# Patient Record
Sex: Male | Born: 2000 | Race: White | Hispanic: No | Marital: Single | Smoking: Current every day smoker
Health system: Southern US, Community
[De-identification: ages and names within clinical notes are randomized; demographics above are authoritative.]

## PROBLEM LIST (undated history)

## (undated) DIAGNOSIS — J45909 Unspecified asthma, uncomplicated: Secondary | ICD-10-CM

## (undated) HISTORY — PX: TONSILLECTOMY: SUR1361

## (undated) HISTORY — PX: TYMPANOPLASTY: SHX33

---
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
## 2022-02-27 ENCOUNTER — Emergency Department
Admission: EM | Admit: 2022-02-27 | Discharge: 2022-02-27 | Disposition: A | Discharge status: Home / Self Care | Payer: Medicaid Other | Attending: Emergency Medicine | Admitting: Emergency Medicine

## 2022-02-27 ENCOUNTER — Emergency Department: Payer: Medicaid Other

## 2022-02-27 ENCOUNTER — Other Ambulatory Visit: Payer: Self-pay

## 2022-02-27 DIAGNOSIS — K5900 Constipation, unspecified: Secondary | ICD-10-CM | POA: Insufficient documentation

## 2022-02-27 DIAGNOSIS — R1031 Right lower quadrant pain: Secondary | ICD-10-CM

## 2022-02-27 LAB — COMPREHENSIVE METABOLIC PANEL
ALT: 6 U/L (ref 0–44)
AST: 22 U/L (ref 15–41)
Albumin: 5.1 g/dL — ABNORMAL HIGH (ref 3.5–5.0)
Alkaline Phosphatase: 57 U/L (ref 38–126)
Anion gap: 8 (ref 5–15)
BUN: 15 mg/dL (ref 6–20)
CO2: 26 mmol/L (ref 22–32)
Calcium: 9.7 mg/dL (ref 8.9–10.3)
Chloride: 105 mmol/L (ref 98–111)
Creatinine, Ser: 0.95 mg/dL (ref 0.61–1.24)
GFR, Estimated: >60 mL/min (ref >60)
Glucose, Bld: 90 mg/dL (ref 70–99)
Potassium: 4.1 mmol/L (ref 3.5–5.1)
Sodium: 139 mmol/L (ref 135–145)
Total Bilirubin: 1.1 mg/dL (ref 0.3–1.2)
Total Protein: 8.5 g/dL — ABNORMAL HIGH (ref 6.5–8.1)

## 2022-02-27 LAB — CBC WITH DIFFERENTIAL/PLATELET
Abs Immature Granulocytes: 0.02 10*3/uL (ref 0.00–0.07)
Basophils Absolute: 0 10*3/uL (ref 0.0–0.1)
Basophils Relative: 1 %
Eosinophils Absolute: 0.1 10*3/uL (ref 0.0–0.5)
Eosinophils Relative: 2 %
HCT: 44.2 % (ref 39.0–52.0)
Hemoglobin: 15.4 g/dL (ref 13.0–17.0)
Immature Granulocytes: 0 %
Lymphocytes Relative: 34 %
Lymphs Abs: 2 10*3/uL (ref 0.7–4.0)
MCH: 30.4 pg (ref 26.0–34.0)
MCHC: 34.8 g/dL (ref 30.0–36.0)
MCV: 87.4 fL (ref 80.0–100.0)
Monocytes Absolute: 0.5 10*3/uL (ref 0.1–1.0)
Monocytes Relative: 8 %
Neutro Abs: 3.3 10*3/uL (ref 1.7–7.7)
Neutrophils Relative %: 55 %
Platelets: 218 10*3/uL (ref 150–400)
RBC: 5.06 MIL/uL (ref 4.22–5.81)
RDW: 12 % (ref 11.5–15.5)
WBC: 5.9 10*3/uL (ref 4.0–10.5)
nRBC: 0 % (ref 0.0–0.2)

## 2022-02-27 LAB — LIPASE, BLOOD: Lipase: 38 U/L (ref 11–51)

## 2022-02-27 MED ORDER — IOHEXOL 350 MG/ML SOLN
75.0000 mL | Freq: Once | INTRAVENOUS | Status: AC | PRN
  Administered 2022-02-27: 75 mL via INTRAVENOUS

## 2022-02-27 NOTE — Discharge Instructions (Addendum)

## 2022-02-27 NOTE — ED Triage Notes (Signed)
Pt in the ED for abdominal pain. Pt has been having these issues since he was 21 years old. Pt states he feels nauseated. Pt denies fever. Mother gave pt one of her muscle relaxer's and it did not relieve symptoms. Pt is CAOx4 and in no acute distress. Pt is still able to eat with no issues. He was at [facility] when it started bothering him again so he came here. Pt has seen in the ER since he was 14.

## 2022-02-27 NOTE — ED Provider Notes (Signed)
[facility] Provider Note    Event Date/Time   First MD Initiated Contact with Patient 02/27/22 2148     (approximate)   History   Chief Complaint Abdominal Pain   HPI  Johnathan Austin is a 21 y.o. male with no significant past medical history who presents to the ED complaining of abdominal pain.  Patient reports that he has been dealing with intermittent episodes of pain in the right lower quadrant of his abdomen ever since he was 21 years old.  He has been seen by his PCP for this problem before and referred to GI, but states he was not able to see them due to insurance issues.  He had another episode of pain this evening, which she describes as sudden onset sharp and stabbing pain in the right lower quadrant of his abdomen.  He denies any pain in his testicles and has not had any difficulty urinating.  He has not had any fever, flank pain, or dysuria.  He felt nauseous with the onset of pain but states this has since improved and he has not had any vomiting or diarrhea.  He denies prior abdominal surgeries.     Physical Exam   Triage Vital Signs: ED Triage Vitals  Enc Vitals Group     BP 02/27/22 1732 (!) 135/99     Pulse Rate 02/27/22 1732 89     Resp 02/27/22 1732 18     Temp 02/27/22 1732 98.2 F (36.8 C)     Temp Source 02/27/22 1732 Oral     SpO2 02/27/22 1732 98 %     Weight 02/27/22 1750 154 lb (69.9 kg)     Height 02/27/22 1750 6\' 2"  (1.88 m)     Head Circumference --      Peak Flow --      Pain Score 02/27/22 1749 9     Pain Loc --      Pain Edu? --      Excl. in [place]? --     Most recent vital signs: Vitals:   02/27/22 1732 02/27/22 2213  BP: (!) 135/99 124/74  Pulse: 89 (!) 56  Resp: 18 16  Temp: 98.2 F (36.8 C) 97.7 F (36.5 C)  SpO2: 98% 98%    Constitutional: Alert and oriented. Eyes: Conjunctivae are normal. Head: Atraumatic. Nose: No congestion/rhinnorhea. Mouth/Throat: Mucous membranes are moist.  Cardiovascular:  Normal rate, regular rhythm. Grossly normal heart sounds.  2+ radial pulses bilaterally. Respiratory: Normal respiratory effort.  No retractions. Lungs CTAB. Gastrointestinal: Soft and tender to palpation in the right lower quadrant with no rebound or guarding.  No CVA tenderness bilaterally. No distention. Musculoskeletal: No lower extremity tenderness nor edema.  Neurologic:  Normal speech and language. No gross focal neurologic deficits are appreciated.    ED Results / Procedures / Treatments   Labs (all labs ordered are listed, but only abnormal results are displayed) Labs Reviewed  COMPREHENSIVE METABOLIC PANEL - Abnormal; Notable for the following components:      Result Value   Total Protein 8.5 (*)    Albumin 5.1 (*)    All other components within normal limits  CBC WITH DIFFERENTIAL/PLATELET  LIPASE, BLOOD  URINALYSIS, ROUTINE W REFLEX MICROSCOPIC   RADIOLOGY CT abdomen/pelvis reviewed and interpreted by me with no inflammatory changes, focal fluid collections, or dilated bowel loops.  PROCEDURES:  Critical Care performed: No  Procedures   MEDICATIONS ORDERED IN ED: Medications  iohexol (OMNIPAQUE) 350 MG/ML injection  75 mL (75 mLs Intravenous Contrast Given 02/27/22 1829)     IMPRESSION / MDM / ASSESSMENT AND PLAN / ED COURSE  I reviewed the triage vital signs and the nursing notes.                              21 y.o. male with no significant past medical history presents to the ED with sudden onset pain in the right lower quadrant of his abdomen earlier this evening, similar to previous episodes that he has experienced for multiple years.  Patient's presentation is most consistent with acute presentation with potential threat to life or bodily function.  Differential diagnosis includes, but is not limited to, appendicitis, kidney stone, UTI, inguinal hernia.  Patient nontoxic-appearing and in no acute distress, vital signs are unremarkable.  He does have  tenderness to palpation in the right lower quadrant of his abdomen, CT scan performed from triage and is negative for acute process.  No evidence of appendicitis, kidney stone, or inguinal hernia.  Symptoms do not seem consistent with testicular torsion or other testicular pathology.  Labs are reassuring with no significant anemia, leukocytosis, electrolyte abnormality, or AKI.  LFTs and lipase are unremarkable.  Patient does report significant issues with constipation, which may be contributing to his symptoms.  He was counseled on regular bowel regimen and will be given new referral to GI.  He was counseled to return to the ED for new or worsening symptoms, patient agrees with plan.      FINAL CLINICAL IMPRESSION(S) / ED DIAGNOSES   Final diagnoses:  Right lower quadrant abdominal pain  Constipation, unspecified constipation type     Rx / DC Orders   ED Discharge Orders     None        Note:  This document was prepared using Dragon voice recognition software and may include unintentional dictation errors.   Chesley Noon, MD 02/27/22 2308

## 2022-03-23 ENCOUNTER — Ambulatory Visit (INDEPENDENT_AMBULATORY_CARE_PROVIDER_SITE_OTHER): Payer: Medicaid Other | Admitting: Gastroenterology

## 2022-03-23 ENCOUNTER — Encounter: Payer: Self-pay | Admitting: Gastroenterology

## 2022-03-23 VITALS — BP 132/82 | HR 111 | Temp 98.7°F | Ht 74.0 in | Wt 136.0 lb

## 2022-03-23 DIAGNOSIS — M7918 Myalgia, other site: Secondary | ICD-10-CM | POA: Diagnosis not present

## 2022-03-23 NOTE — Progress Notes (Signed)
Gastroenterology Consultation  Referring Provider:     Pa, Amada Jupiter* Primary Care Physician:  [place] Primary Gastroenterologist:  Dr. Servando Snare     Reason for Consultation:     Right lower quadrant pain        HPI:   Johnathan Austin is a 21 y.o. y/o male referred for consultation & management of Right lower quadrant pain by Dr. Gean Birchwood, [facility].  This patient comes in today after a report of right lower quadrant pain that he states is been going on for years.  The patient states that it is not made better or worse with anything he eats or drinks.  He also reported not to be associated with any bowel movements although he states that he can have an increase in the pain if he strained to have hard stools.  There is no report of any unexplained weight loss fevers chills nausea vomiting black stools or bloody stools.  The patient does state that he takes his mother's muscle relaxer that makes his symptoms better.  There is no report of any unexplained weight loss.  He does report that he works in a very physical demanding job.  Past Medical History:  Diagnosis Date   Asthma     Past Surgical History:  Procedure Laterality Date   TONSILLECTOMY     TYMPANOPLASTY      Prior to Admission medications   Medication Sig Start Date End Date Taking? Authorizing Provider  acetaminophen (TYLENOL) 325 MG tablet Take 650 mg by mouth every 6 (six) hours as needed.   Yes [provider]    No family history on file.   Social History   Tobacco Use   Smoking status: Every Day    Types: Cigarettes    Passive exposure: Yes   Smokeless tobacco: Never  Substance Use Topics   Alcohol use: No   Drug use: Not Currently    Allergies as of 03/23/2022 - Review Complete 02/27/2022  Allergen Reaction Noted   Other  01/26/2016    Review of Systems:    All systems reviewed and negative except where noted in HPI.   Physical Exam:  BP 132/82 (BP Location:  Left Arm, Patient Position: Sitting, Cuff Size: Normal)   Pulse (!) 111   Temp 98.7 F (37.1 C) (Oral)   Ht 6\' 2"  (1.88 m)   Wt 136 lb (61.7 kg)   BMI 17.46 kg/m  No LMP for male patient. General:   Alert,  Well-developed, well-nourished, pleasant and cooperative in NAD Head:  Normocephalic and atraumatic. Eyes:  Sclera clear, no icterus.   Conjunctiva pink. Ears:  Normal auditory acuity. Neck:  Supple; no masses or thyromegaly. Lungs:  Respirations even and unlabored.  Clear throughout to auscultation.   No wheezes, crackles, or rhonchi. No acute distress. Heart:  Regular rate and rhythm; no murmurs, clicks, rubs, or gallops. Abdomen:  Normal bowel sounds.  No bruits.  Soft, Positive tenderness to one finger palpation on flexing the abdominal wall muscles and non-distended without masses, hepatosplenomegaly or hernias noted.  No guarding or rebound tenderness.  Positive Carnett sign.   Rectal:  Deferred.  Pulses:  Normal pulses noted. Extremities:  No clubbing or edema.  No cyanosis. Neurologic:  Alert and oriented x3;  grossly normal neurologically. Skin:  Intact without significant lesions or rashes.  No jaundice. Lymph Nodes:  No significant cervical adenopathy. Psych:  Alert and cooperative. Normal mood and affect.  Imaging Studies: CT Abdomen  Pelvis W Contrast  Result Date: 02/27/2022 CLINICAL DATA:  RLQ abdominal pain. Pt in the ED for abdominal pain. Pt has been having these issues since he was 21 years old. Pt states he feels nauseated. Pt denies fever. Mother gave pt one of her muscle relaxer's and it did not relieve symptoms. EXAM: CT ABDOMEN AND PELVIS WITH CONTRAST TECHNIQUE: Multidetector CT imaging of the abdomen and pelvis was performed using the standard protocol following bolus administration of intravenous contrast. RADIATION DOSE REDUCTION: This exam was performed according to the departmental dose-optimization program which includes automated exposure control,  adjustment of the mA and/or kV according to patient size and/or use of iterative reconstruction technique. CONTRAST:  59mL OMNIPAQUE IOHEXOL 350 MG/ML SOLN COMPARISON:  None Available. FINDINGS: Lower chest: No acute abnormality. Hepatobiliary: No focal liver abnormality. No gallstones, gallbladder wall thickening, or pericholecystic fluid. No biliary dilatation. Pancreas: No focal lesion. Normal pancreatic contour. No surrounding inflammatory changes. No main pancreatic ductal dilatation. Spleen: Normal in size without focal abnormality. Adrenals/Urinary Tract: No adrenal nodule bilaterally. Bilateral kidneys enhance symmetrically. No hydronephrosis. No hydroureter. The urinary bladder is unremarkable. Stomach/Bowel: Stomach is within normal limits. No evidence of bowel wall thickening or dilatation. Under distended rectum. Appendix appears normal. Vascular/Lymphatic: No abdominal aorta or iliac aneurysm.No abdominal, pelvic, or inguinal lymphadenopathy. Reproductive: Prostate is unremarkable. Other: Trace nonspecific simple free fluid within the pelvis. No intraperitoneal free gas. No organized fluid collection. Musculoskeletal: No abdominal wall hernia or abnormality. No suspicious lytic or blastic osseous lesions. No acute displaced fracture. Multilevel degenerative changes of the spine. IMPRESSION: No acute intra-abdominal or intrapelvic abnormality. Electronically Signed   By: Tish Frederickson M.D.   On: 02/27/2022 18:47    Assessment and Plan:   Johnathan Austin is a 21 y.o. y/o male Who comes in today with a history of right-sided abdominal pain not associated with any GI symptoms and reports that it is made better when he takes muscle relaxants.  The patient's symptoms are consistent with musculoskeletal pain.  The patient has pain and reproducible by lifting his legs 6 inches above the exam table while palpating the right lower abdomen with 1 finger.  The patient has been explained this and has been  told to use warm compresses and avoid any further straining of the abdominal wall muscles.  The patient has been explained the plan and agrees with it.    Midge Minium, MD. Clementeen Graham    Note: This dictation was prepared with Dragon dictation along with smaller phrase technology. Any transcriptional errors that result from this process are unintentional.

## 2022-04-22 DIAGNOSIS — R519 Headache, unspecified: Secondary | ICD-10-CM | POA: Diagnosis not present

## 2022-04-27 DIAGNOSIS — Z0000 Encounter for general adult medical examination without abnormal findings: Secondary | ICD-10-CM | POA: Diagnosis not present

## 2022-04-27 DIAGNOSIS — Z68.41 Body mass index (BMI) pediatric, less than 5th percentile for age: Secondary | ICD-10-CM | POA: Diagnosis not present

## 2022-04-27 DIAGNOSIS — Z7189 Other specified counseling: Secondary | ICD-10-CM | POA: Diagnosis not present

## 2022-04-27 DIAGNOSIS — Z713 Dietary counseling and surveillance: Secondary | ICD-10-CM | POA: Diagnosis not present

## 2022-06-16 ENCOUNTER — Other Ambulatory Visit: Payer: Self-pay

## 2022-06-16 ENCOUNTER — Emergency Department: Payer: Medicaid Other

## 2022-06-16 ENCOUNTER — Emergency Department
Admission: EM | Admit: 2022-06-16 | Discharge: 2022-06-16 | Disposition: A | Discharge status: Home / Self Care | Payer: Medicaid Other | Attending: Emergency Medicine | Admitting: Emergency Medicine

## 2022-06-16 DIAGNOSIS — R112 Nausea with vomiting, unspecified: Secondary | ICD-10-CM | POA: Insufficient documentation

## 2022-06-16 DIAGNOSIS — R9431 Abnormal electrocardiogram [ECG] [EKG]: Secondary | ICD-10-CM | POA: Diagnosis not present

## 2022-06-16 DIAGNOSIS — W19XXXA Unspecified fall, initial encounter: Secondary | ICD-10-CM | POA: Insufficient documentation

## 2022-06-16 DIAGNOSIS — R1031 Right lower quadrant pain: Secondary | ICD-10-CM | POA: Diagnosis not present

## 2022-06-16 DIAGNOSIS — R519 Headache, unspecified: Secondary | ICD-10-CM | POA: Diagnosis not present

## 2022-06-16 DIAGNOSIS — R55 Syncope and collapse: Secondary | ICD-10-CM | POA: Insufficient documentation

## 2022-06-16 LAB — CBC
HCT: 43.4 % (ref 39.0–52.0)
Hemoglobin: 15 g/dL (ref 13.0–17.0)
MCH: 30.6 pg (ref 26.0–34.0)
MCHC: 34.6 g/dL (ref 30.0–36.0)
MCV: 88.6 fL (ref 80.0–100.0)
Platelets: 248 10*3/uL (ref 150–400)
RBC: 4.9 MIL/uL (ref 4.22–5.81)
RDW: 12.3 % (ref 11.5–15.5)
WBC: 7 10*3/uL (ref 4.0–10.5)
nRBC: 0 % (ref 0.0–0.2)

## 2022-06-16 LAB — COMPREHENSIVE METABOLIC PANEL
ALT: 10 U/L (ref 0–44)
AST: 22 U/L (ref 15–41)
Albumin: 4.9 g/dL (ref 3.5–5.0)
Alkaline Phosphatase: 63 U/L (ref 38–126)
Anion gap: 8 (ref 5–15)
BUN: 19 mg/dL (ref 6–20)
CO2: 27 mmol/L (ref 22–32)
Calcium: 9.5 mg/dL (ref 8.9–10.3)
Chloride: 104 mmol/L (ref 98–111)
Creatinine, Ser: 1.02 mg/dL (ref 0.61–1.24)
GFR, Estimated: >60 mL/min (ref >60)
Glucose, Bld: 103 mg/dL — ABNORMAL HIGH (ref 70–99)
Potassium: 4 mmol/L (ref 3.5–5.1)
Sodium: 139 mmol/L (ref 135–145)
Total Bilirubin: 0.7 mg/dL (ref 0.3–1.2)
Total Protein: 8 g/dL (ref 6.5–8.1)

## 2022-06-16 LAB — LIPASE, BLOOD: Lipase: 37 U/L (ref 11–51)

## 2022-06-16 MED ORDER — ONDANSETRON 8 MG PO TBDP: 8.0000 mg | ORAL_TABLET | Freq: Three times a day (TID) | ORAL | 0 refills | Status: AC | PRN

## 2022-06-16 MED ORDER — ONDANSETRON 4 MG PO TBDP
8.0000 mg | ORAL_TABLET | Freq: Once | ORAL | Status: AC
  Administered 2022-06-16: 8 mg via ORAL
  Filled 2022-06-16: qty 2

## 2022-06-16 NOTE — ED Triage Notes (Addendum)
Pt to ED via POV c/o of RLQ abd pain and fall/loc. Pt states RLQ pain started around 630 this afternoon. Pt still has appendix. Pt also reports tripping over porch stairs, hit head, and had LOC. Pt endorsing headache and lightheadedness. Denies CP, SOB, n/v, fevers

## 2022-06-16 NOTE — ED Provider Notes (Signed)
[facility] Provider Note   Event Date/Time   First MD Initiated Contact with Patient 06/16/22 2159     (approximate) History  Abdominal Pain and Loss of Consciousness  HPI Johnathan Austin is a 22 y.o. male with a past medical history presents for right lower quadrant abdominal pain with associated nausea/vomiting that began this evening.  Patient also states that he is concerned as he had a fall a few days prior with loss of consciousness and head injury.  Patient describes the right lower quadrant abdominal pain is 9/10, nonradiating, and stable since onset.  Patient denies any exacerbating or relieving factors. ROS: Patient currently denies any vision changes, tinnitus, difficulty speaking, facial droop, sore throat, chest pain, shortness of breath, diarrhea, dysuria, or weakness/numbness/paresthesias in any extremity   Physical Exam  Triage Vital Signs: ED Triage Vitals  Enc Vitals Group     BP 06/16/22 2030 (!) 139/92     Pulse Rate 06/16/22 2030 95     Resp 06/16/22 2030 18     Temp 06/16/22 2030 98.3 F (36.8 C)     Temp Source 06/16/22 2030 Oral     SpO2 06/16/22 2030 100 %     Weight 06/16/22 2031 155 lb (70.3 kg)     Height 06/16/22 2031 '6\' 1"'$  (1.854 m)     Head Circumference --      Peak Flow --      Pain Score 06/16/22 2031 9     Pain Loc --      Pain Edu? --      Excl. in [place]? --    Most recent vital signs: Vitals:   06/16/22 2030 06/16/22 2306  BP: (!) 139/92 127/84  Pulse: 95 89  Resp: 18 18  Temp: 98.3 F (36.8 C)   SpO2: 100% 99%   General: Awake, oriented x4. CV:  Good peripheral perfusion.  Resp:  Normal effort.  Abd:  No distention.  Other:  Teenage Caucasian male laying in bed in no acute distress ED Results / Procedures / Treatments  Labs (all labs ordered are listed, but only abnormal results are displayed) Labs Reviewed  COMPREHENSIVE METABOLIC PANEL - Abnormal; Notable for the following components:      Result  Value   Glucose, Bld 103 (*)    All other components within normal limits  LIPASE, BLOOD  CBC  RADIOLOGY ED MD interpretation: CT of the head without contrast interpreted by me shows no evidence of acute abnormalities including no intracerebral hemorrhage, obvious masses, or significant edema -Agree with radiology assessment Official radiology report(s): CT HEAD WO CONTRAST  Result Date: 06/16/2022 CLINICAL DATA:  Head trauma.  RIGHT lower quadrant pain EXAM: CT HEAD WITHOUT CONTRAST TECHNIQUE: Contiguous axial images were obtained from the base of the skull through the vertex without intravenous contrast. RADIATION DOSE REDUCTION: This exam was performed according to the departmental dose-optimization program which includes automated exposure control, adjustment of the mA and/or kV according to patient size and/or use of iterative reconstruction technique. COMPARISON:  None Available. FINDINGS: Brain: No acute intracranial hemorrhage. No focal mass lesion. No CT evidence of acute infarction. No midline shift or mass effect. No hydrocephalus. Basilar cisterns are patent. Vascular: No hyperdense vessel or unexpected calcification. Skull: Normal. Negative for fracture or focal lesion. Sinuses/Orbits: Paranasal sinuses and mastoid air cells are clear. Orbits are clear. Other: None. IMPRESSION: No intracranial trauma. Electronically Signed   By: Suzy Bouchard M.D.   On: 06/16/2022 20:50  PROCEDURES: Critical Care performed: No .1-3 Lead EKG Interpretation  Performed by: Naaman Plummer, MD Authorized by: Naaman Plummer, MD     Interpretation: normal     ECG rate:  71   ECG rate assessment: normal     Rhythm: sinus rhythm     Ectopy: none     Conduction: normal    MEDICATIONS ORDERED IN ED: Medications  ondansetron (ZOFRAN-ODT) disintegrating tablet 8 mg (8 mg Oral Given 06/16/22 2212)   IMPRESSION / MDM / ASSESSMENT AND PLAN / ED COURSE  I reviewed the triage vital signs and the nursing  notes.                             The patient is on the cardiac monitor to evaluate for evidence of arrhythmia and/or significant heart rate changes. Patient's presentation is most consistent with acute presentation with potential threat to life or bodily function. Patient presents for abdominal pain.  Differential diagnosis includes appendicitis, abdominal aortic aneurysm, surgical biliary disease, pancreatitis, SBO, mesenteric ischemia, serious intra-abdominal bacterial illness, genital torsion. Doubt atypical ACS. Based on history, physical exam, radiologic/laboratory evaluation, there is no red flag results or symptomatology requiring emergent intervention or need for admission at this time Pt tolerating PO. Disposition: Patient will be discharged with strict return precautions and follow up with primary MD within 12-24 hours for further evaluation. Patient understands that this still may have an early presentation of an emergent medical condition such as appendicitis that will require a recheck.   FINAL CLINICAL IMPRESSION(S) / ED DIAGNOSES   Final diagnoses:  Right lower quadrant abdominal pain  Fall, initial encounter   Rx / DC Orders   ED Discharge Orders          Ordered    ondansetron (ZOFRAN-ODT) 8 MG disintegrating tablet  Every 8 hours PRN        06/16/22 2244           Note:  This document was prepared using Dragon voice recognition software and may include unintentional dictation errors.   Naaman Plummer, MD 06/16/22 [phone]

## 2022-06-16 NOTE — ED Notes (Signed)
Patient discharged at this time. Ambulated to lobby with independent and steady gait. Breathing unlabored speaking in full sentences. Verbalized understanding of all discharge, follow up, and medication teaching. Discharged homed with all belongings.   

## 2022-09-13 IMAGING — CR DG FOOT COMPLETE 3+V*L*
3 series · 3 of 3 positions shown · non-contrast
Comparison: None.

CLINICAL DATA: Blunt trauma.

EXAM:
LEFT FOOT - COMPLETE 3+ VIEW

[foot ap]
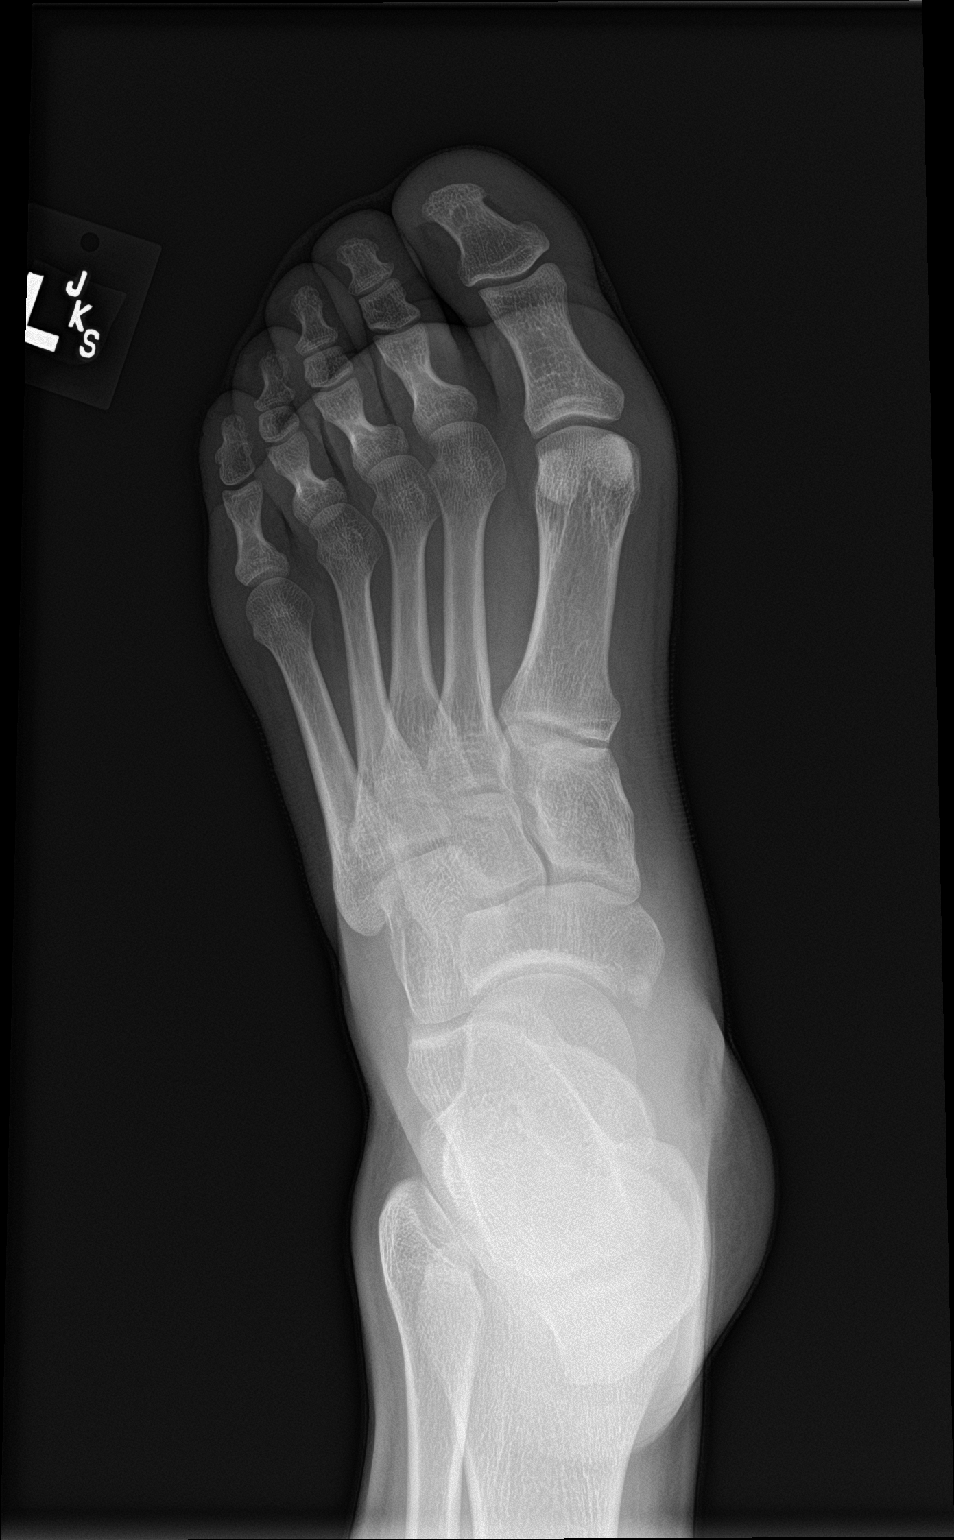

[foot obl]
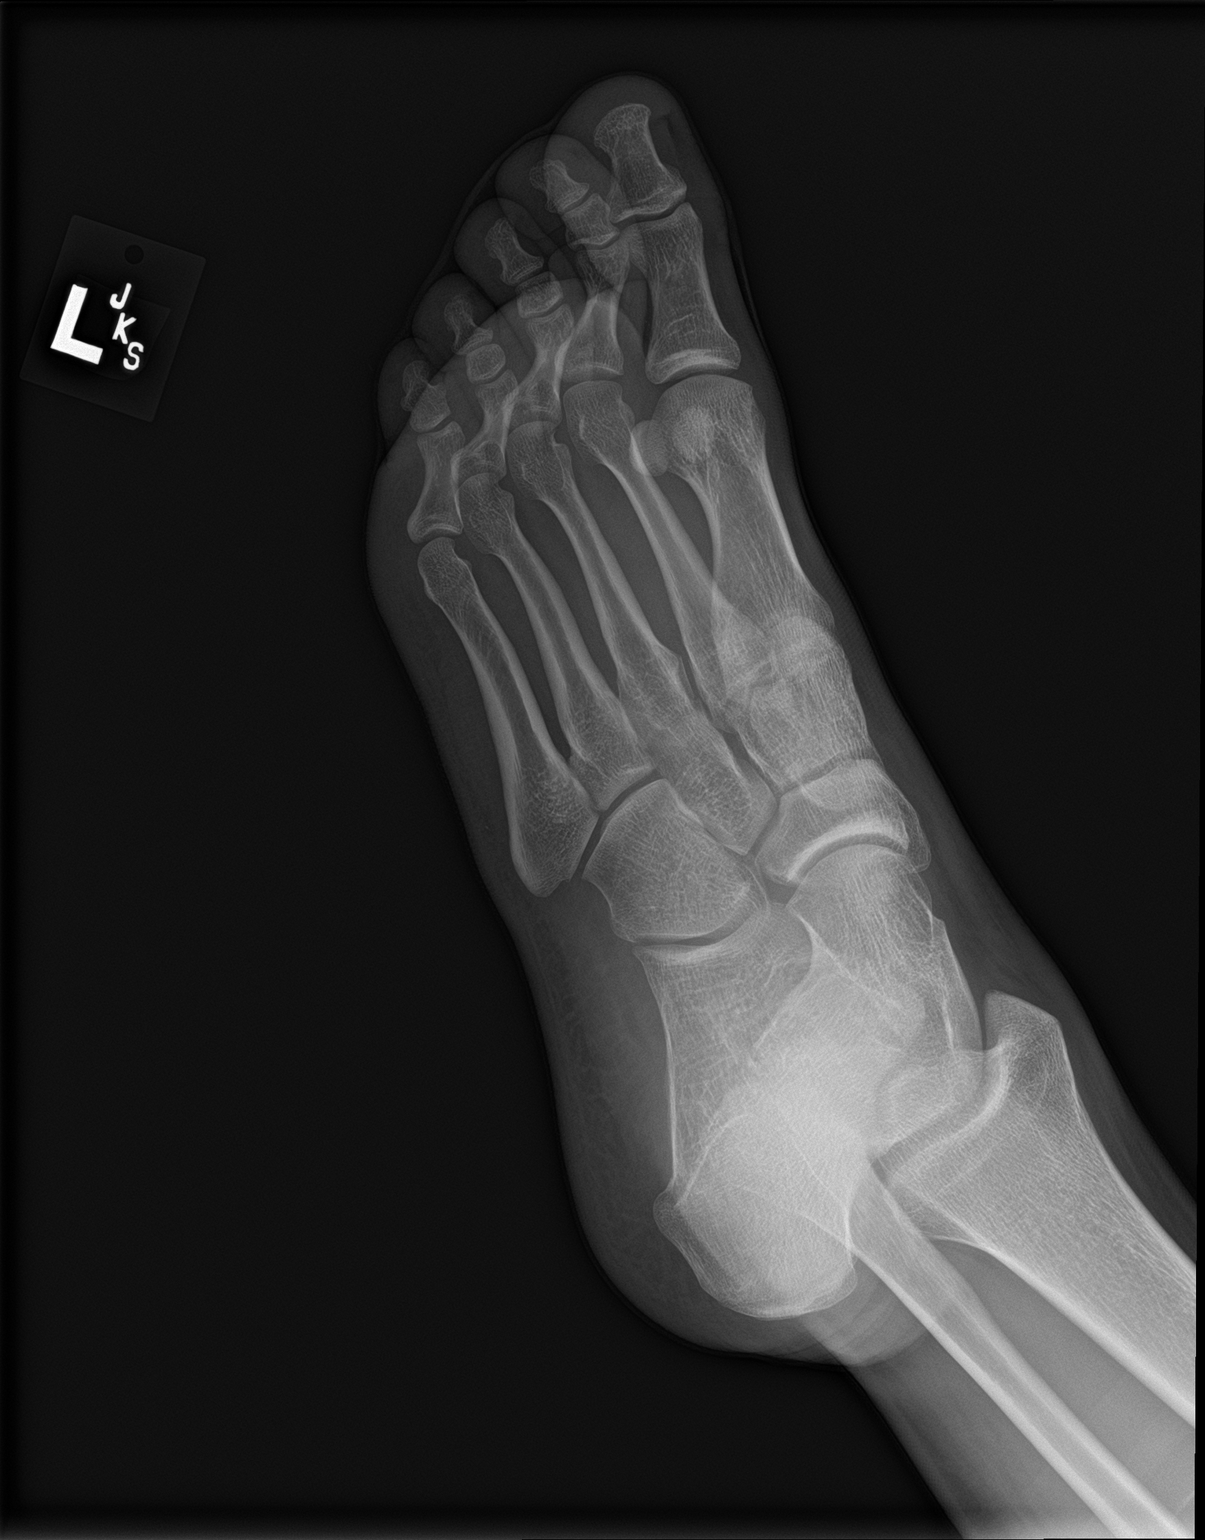

[foot lat]
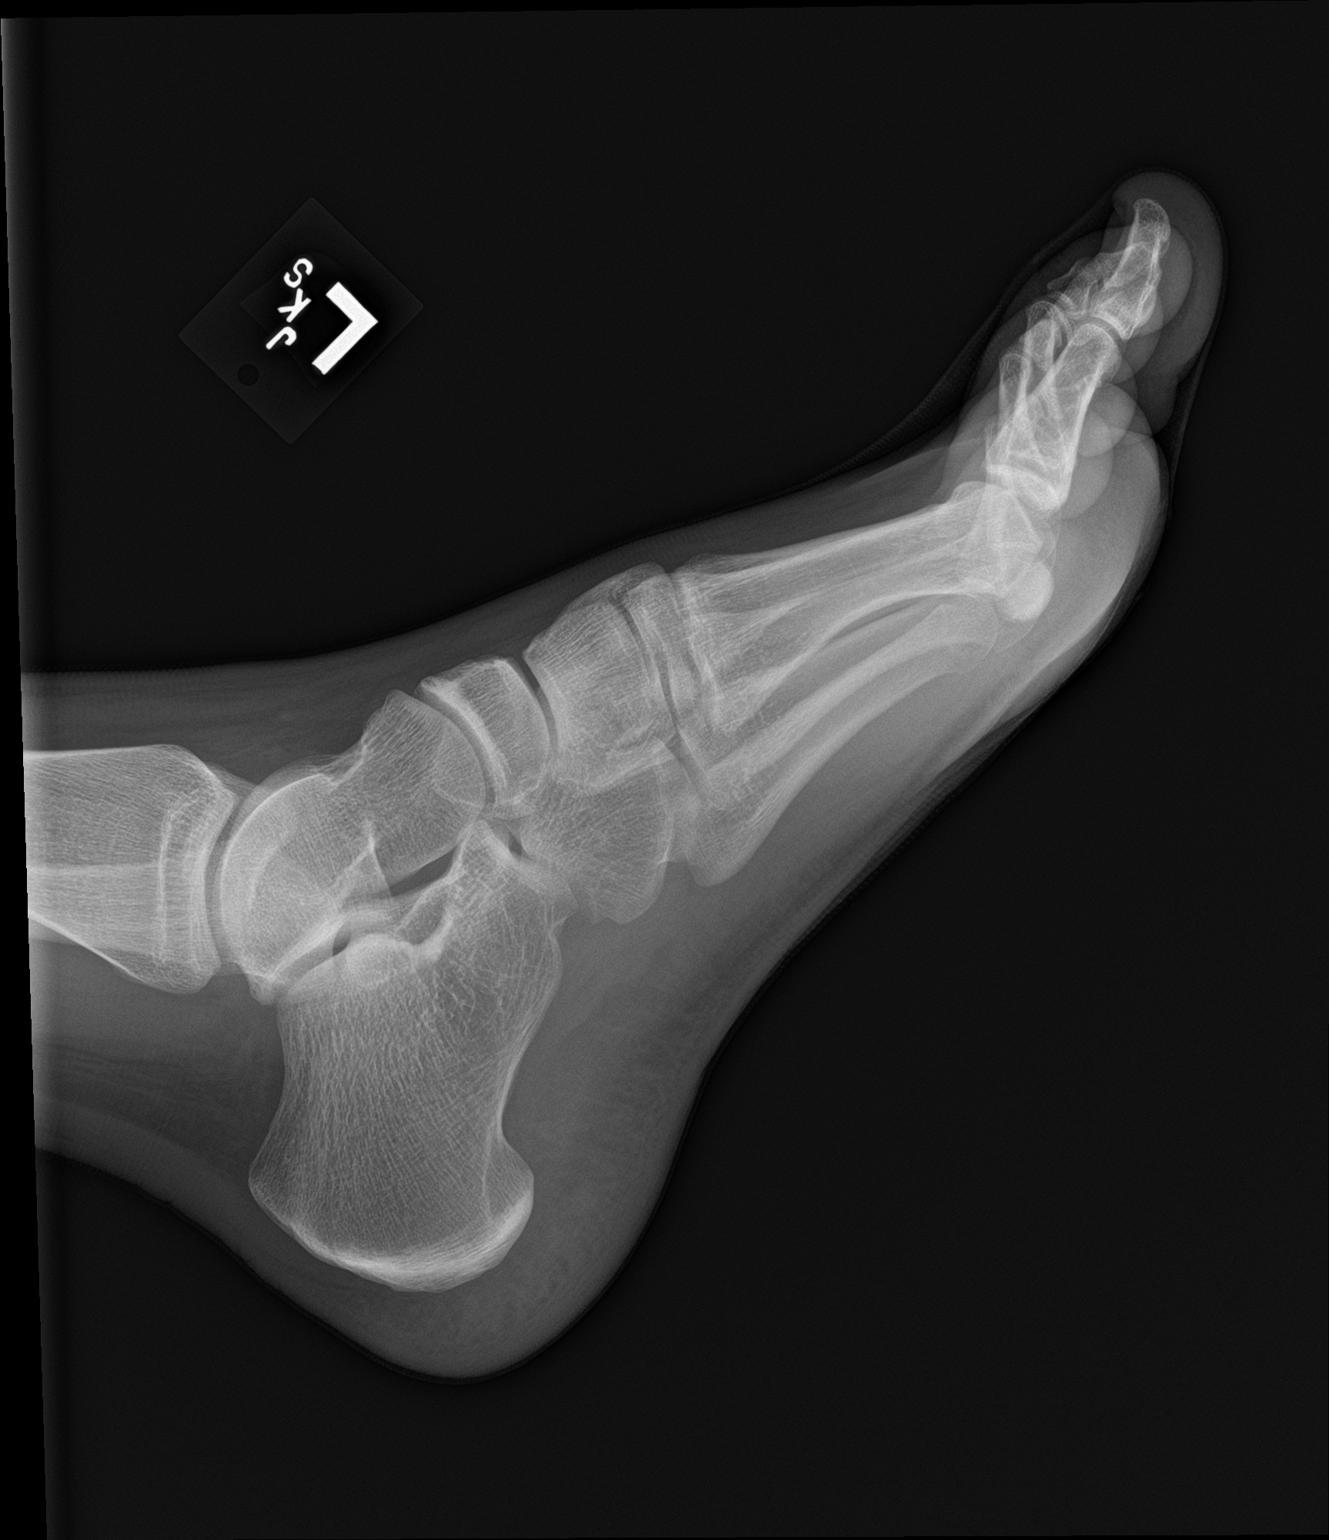

[3 of 3 positions shown; findings below may reference images not displayed]

FINDINGS: No fracture or dislocation of mid foot or forefoot. The phalanges
are normal. The calcaneus is normal. No soft tissue abnormality.
IMPRESSION: No fracture or dislocation.

## 2023-10-08 ENCOUNTER — Emergency Department
Admission: EM | Admit: 2023-10-08 | Discharge: 2023-10-08 | Disposition: A | Discharge status: Home / Self Care | Attending: Emergency Medicine | Admitting: Emergency Medicine

## 2023-10-08 ENCOUNTER — Emergency Department

## 2023-10-08 ENCOUNTER — Other Ambulatory Visit: Payer: Self-pay

## 2023-10-08 DIAGNOSIS — M25522 Pain in left elbow: Secondary | ICD-10-CM | POA: Insufficient documentation

## 2023-10-08 DIAGNOSIS — Y9366 Activity, soccer: Secondary | ICD-10-CM | POA: Diagnosis not present

## 2023-10-08 DIAGNOSIS — W010XXA Fall on same level from slipping, tripping and stumbling without subsequent striking against object, initial encounter: Secondary | ICD-10-CM | POA: Diagnosis not present

## 2023-10-08 DIAGNOSIS — J45909 Unspecified asthma, uncomplicated: Secondary | ICD-10-CM | POA: Insufficient documentation

## 2023-10-08 NOTE — ED Provider Notes (Signed)
   [facility] Provider Note    Event Date/Time   First MD Initiated Contact with Patient 10/08/23 1315     (approximate)   History   Arm Injury   HPI  Johnathan Austin is a 23 y.o. male with history of asthma and as listed in EMR presents to the emergency department for treatment and evaluation of left elbow pain after tripping over a curb last night while playing soccer with his nieces and landed on the pavement.  Pain increases with attempt to fully extend.  Some relief with ibuprofen .      Physical Exam   Triage Vital Signs:   Most recent vital signs: Vitals:   10/08/23 1301  BP: 119/80  Pulse: 77  Resp: 15  Temp: 98.9 F (37.2 C)  SpO2: 100%    General: Awake, no distress.  CV:  Good peripheral perfusion.  Resp:  Normal effort.  Abd:  No distention.  Other:  No deformity noted of the left elbow.  Tender over the olecranon and lateral aspect of the left elbow.  Pain with attempt to fully extend at the elbow.  Full range of motion of the left shoulder, hand, and wrist without pain.   ED Results / Procedures / Treatments   Labs (all labs ordered are listed, but only abnormal results are displayed) Labs Reviewed - No data to display   EKG  Not indicated   RADIOLOGY  Image and radiology report reviewed and interpreted by me. Radiology report consistent with the same.  Imaging of the left elbow negative for acute concerns.  PROCEDURES:  Critical Care performed: No  Procedures   MEDICATIONS ORDERED IN ED:  Medications - No data to display   IMPRESSION / MDM / ASSESSMENT AND PLAN / ED COURSE   I have reviewed the triage note.  Differential diagnosis includes, but is not limited to,  radial head fracture, proximal ulna fracture, olecranon fracture.,  Elbow strain  Patient's presentation is most consistent with acute illness / injury with system symptoms.  23 year old male presenting to the emergency department after  falling last night while playing soccer.  See HPI for further details.  Vital signs are stable.  On exam, he is unable to fully extend at the left elbow without significant pain.  X-ray does not show an occult fracture..  Symptoms are somewhat concerning for nondisplaced radial head fracture.  Plan will be to place him in a sling for no more than 3 days.  He is to remove the sling at least 3 times a day to perform gentle range of motion.  He will continue to take the ibuprofen .  If not improving over the week, he is to follow-up with orthopedics.      FINAL CLINICAL IMPRESSION(S) / ED DIAGNOSES   Final diagnoses:  Elbow pain, left     Rx / DC Orders   ED Discharge Orders     None        Note:  This document was prepared using Dragon voice recognition software and may include unintentional dictation errors.   Herlinda Kirk NOVAK, FNP 10/08/23 1547    Dicky Anes, MD 10/08/23 [phone]

## 2023-10-08 NOTE — ED Triage Notes (Signed)
 Pt was playing soccer last night and tripped over a curb and fell onto pavement. Pt c/o L elbow pain. Pt c/o pain with extension.

## 2023-10-08 NOTE — Discharge Instructions (Signed)
 Wear the sling for no more than 3 days.  Take ibuprofen  every 6 hours if needed for pain.  Even if you have the sling on take it off and perform some gentle range of motion at least 3 times a day.  You may also use ice packs 20 minutes/h off-and-on.  Follow-up with orthopedics if pain has not improved over the week.
